# Patient Record
Sex: Male | Born: 2005
Health system: Southern US, Community
[De-identification: ages and names within clinical notes are randomized; demographics above are authoritative.]

---
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
## 2024-02-17 ENCOUNTER — Other Ambulatory Visit: Payer: Self-pay | Admitting: Family Medicine
